# Patient Record
Sex: Male | Born: 1950 | Race: White | Hispanic: No | Smoking: Former smoker
Health system: Southern US, Community
[De-identification: ages and names within clinical notes are randomized; demographics above are authoritative.]

## PROBLEM LIST (undated history)

## (undated) DIAGNOSIS — E785 Hyperlipidemia, unspecified: Secondary | ICD-10-CM

## (undated) DIAGNOSIS — E119 Type 2 diabetes mellitus without complications: Secondary | ICD-10-CM

## (undated) DIAGNOSIS — M542 Cervicalgia: Secondary | ICD-10-CM

## (undated) DIAGNOSIS — J45909 Unspecified asthma, uncomplicated: Secondary | ICD-10-CM

## (undated) DIAGNOSIS — I10 Essential (primary) hypertension: Secondary | ICD-10-CM

## (undated) HISTORY — DX: Cervicalgia: M54.2

## (undated) HISTORY — DX: Type 2 diabetes mellitus without complications: E11.9

## (undated) HISTORY — DX: Unspecified asthma, uncomplicated: J45.909

## (undated) HISTORY — DX: Essential (primary) hypertension: I10

## (undated) HISTORY — DX: Hyperlipidemia, unspecified: E78.5

---

## 2008-06-14 ENCOUNTER — Ambulatory Visit (HOSPITAL_COMMUNITY): Admission: RE | Admit: 2008-06-14 | Discharge: 2008-06-14 | Payer: Self-pay | Admitting: Family Medicine

## 2019-02-24 DIAGNOSIS — E1169 Type 2 diabetes mellitus with other specified complication: Secondary | ICD-10-CM | POA: Diagnosis not present

## 2019-02-24 DIAGNOSIS — J4520 Mild intermittent asthma, uncomplicated: Secondary | ICD-10-CM | POA: Diagnosis not present

## 2019-02-24 DIAGNOSIS — E782 Mixed hyperlipidemia: Secondary | ICD-10-CM | POA: Diagnosis not present

## 2019-02-24 DIAGNOSIS — I10 Essential (primary) hypertension: Secondary | ICD-10-CM | POA: Diagnosis not present

## 2019-03-17 DIAGNOSIS — Z0000 Encounter for general adult medical examination without abnormal findings: Secondary | ICD-10-CM | POA: Diagnosis not present

## 2019-04-22 DIAGNOSIS — I10 Essential (primary) hypertension: Secondary | ICD-10-CM | POA: Diagnosis not present

## 2019-04-22 DIAGNOSIS — R944 Abnormal results of kidney function studies: Secondary | ICD-10-CM | POA: Diagnosis not present

## 2019-04-22 DIAGNOSIS — E1165 Type 2 diabetes mellitus with hyperglycemia: Secondary | ICD-10-CM | POA: Diagnosis not present

## 2019-04-22 DIAGNOSIS — E782 Mixed hyperlipidemia: Secondary | ICD-10-CM | POA: Diagnosis not present

## 2019-04-22 DIAGNOSIS — E119 Type 2 diabetes mellitus without complications: Secondary | ICD-10-CM | POA: Diagnosis not present

## 2019-04-22 DIAGNOSIS — E785 Hyperlipidemia, unspecified: Secondary | ICD-10-CM | POA: Diagnosis not present

## 2019-04-29 DIAGNOSIS — J45909 Unspecified asthma, uncomplicated: Secondary | ICD-10-CM | POA: Diagnosis not present

## 2019-04-29 DIAGNOSIS — Z6832 Body mass index (BMI) 32.0-32.9, adult: Secondary | ICD-10-CM | POA: Diagnosis not present

## 2019-04-29 DIAGNOSIS — E1165 Type 2 diabetes mellitus with hyperglycemia: Secondary | ICD-10-CM | POA: Diagnosis not present

## 2019-04-29 DIAGNOSIS — E785 Hyperlipidemia, unspecified: Secondary | ICD-10-CM | POA: Diagnosis not present

## 2019-04-29 DIAGNOSIS — Z23 Encounter for immunization: Secondary | ICD-10-CM | POA: Diagnosis not present

## 2019-04-29 DIAGNOSIS — I10 Essential (primary) hypertension: Secondary | ICD-10-CM | POA: Diagnosis not present

## 2019-04-29 DIAGNOSIS — Z0001 Encounter for general adult medical examination with abnormal findings: Secondary | ICD-10-CM | POA: Diagnosis not present

## 2019-04-29 DIAGNOSIS — M5442 Lumbago with sciatica, left side: Secondary | ICD-10-CM | POA: Diagnosis not present

## 2019-06-04 ENCOUNTER — Other Ambulatory Visit: Payer: Self-pay

## 2019-06-26 DIAGNOSIS — Z1211 Encounter for screening for malignant neoplasm of colon: Secondary | ICD-10-CM | POA: Diagnosis not present

## 2019-09-03 ENCOUNTER — Encounter: Payer: Self-pay | Admitting: Gastroenterology

## 2019-09-03 ENCOUNTER — Ambulatory Visit (INDEPENDENT_AMBULATORY_CARE_PROVIDER_SITE_OTHER): Payer: PPO | Admitting: Gastroenterology

## 2019-09-03 ENCOUNTER — Other Ambulatory Visit: Payer: Self-pay

## 2019-09-03 VITALS — BP 158/90 | HR 76 | Temp 98.6°F | Ht 75.0 in | Wt 267.0 lb

## 2019-09-03 DIAGNOSIS — R195 Other fecal abnormalities: Secondary | ICD-10-CM | POA: Diagnosis not present

## 2019-09-03 DIAGNOSIS — Z1159 Encounter for screening for other viral diseases: Secondary | ICD-10-CM | POA: Insufficient documentation

## 2019-09-03 MED ORDER — NA SULFATE-K SULFATE-MG SULF 17.5-3.13-1.6 GM/177ML PO SOLN: 1.0000 | Freq: Once | ORAL | 0 refills | Status: AC

## 2019-09-03 NOTE — Progress Notes (Signed)
Assessment and plan reviewed 

## 2019-09-03 NOTE — Patient Instructions (Signed)

## 2019-09-03 NOTE — Progress Notes (Signed)
     09/03/2019 Thomas Hayes [id number] 1951-02-11   HISTORY OF PRESENT ILLNESS: This is a pleasant 68 year old male who is new to our office.  He has been referred here by his PCP, Dr. Nevada Crane, in order to discuss colonoscopy.  He has never had a colonoscopy in the past.  His PCP convinced him to perform an IFOB stool study.  The results were positive so he is here now to reconsider colonoscopy.  He says that he sees occasional bright red blood in his stool that he is always contributed to hemorrhoids.  Otherwise moves his bowels well with no complaints of abdominal pain, etc.  No family history of colon cancer to his knowledge.  Has never been put to sleep with anesthesia in the past so is nervious.   Past Medical History:  Diagnosis Date  . Asthma   . Cervicalgia   . Diabetes mellitus ([place])   . Hyperlipidemia   . Hypertension    History reviewed. No pertinent surgical history.  reports that he quit smoking about 10 years ago. His smoking use included cigarettes. He has never used smokeless tobacco. He reports that he does not drink alcohol or use drugs. family history includes Bone cancer in his mother; Crohn's disease in his sister; Hypertension in his mother; Stroke in his mother. No Known Allergies    Outpatient Encounter Medications as of 09/03/2019  Medication Sig  . albuterol (VENTOLIN HFA) 108 (90 Base) MCG/ACT inhaler Inhale into the lungs every 6 (six) hours as needed for wheezing or shortness of breath.  . hydrochlorothiazide (HYDRODIURIL) 25 MG tablet Take 25 mg by mouth daily.  Marland Kitchen olmesartan (BENICAR) 40 MG tablet Take 40 mg by mouth daily.  . pravastatin (PRAVACHOL) 10 MG tablet Take 10 mg by mouth daily.  . SitaGLIPtin-MetFORMIN HCl (JANUMET XR) [id number] MG TB24 Take 1 tablet by mouth daily.  . Na Sulfate-K Sulfate-Mg Sulf 17.5-3.13-1.6 GM/177ML SOLN Take 1 kit by mouth once for 1 dose.  . [DISCONTINUED] sildenafil (VIAGRA) 25 MG tablet Take 25 mg by mouth daily as needed  for erectile dysfunction.   No facility-administered encounter medications on file as of 09/03/2019.      REVIEW OF SYSTEMS  : All other systems reviewed and negative except where noted in the History of Present Illness.   PHYSICAL EXAM: BP (!) 158/90   Pulse 76   Temp 98.6 F (37 C)   Ht '6\' 3"'$  (1.905 m)   Wt 267 lb (121.1 kg)   BMI 33.37 kg/m  General: Well developed white male in no acute distress Head: Normocephalic and atraumatic Eyes:  Sclerae anicteric, conjunctiva pink. Ears: Normal auditory acuity Lungs: Clear throughout to auscultation; no increased WOB. Heart: Regular rate and rhythm; no M/R/G. Abdomen: Soft, non-distended.  BS present.  Non-tender. Rectal: Will be done at the time of colonoscopy. Musculoskeletal: Symmetrical with no gross deformities  Skin: No lesions on visible extremities Extremities: No edema  Neurological: Alert oriented x 4, grossly non-focal Psychological:  Alert and cooperative. Normal mood and affect  ASSESSMENT AND PLAN: *Positive IFOB:  Never had colonoscopy in the past.  Sees occasional bright red blood in stool that he contributes to hemorrhoids.  Will plan for colonoscopy with Dr. Henrene Pastor.  **The risks, benefits, and alternatives to colonoscopy were discussed with the patient and he consents to proceed.  CC:  Celene Squibb, MD

## 2019-09-23 ENCOUNTER — Encounter: Payer: Self-pay | Admitting: Internal Medicine

## 2019-10-04 ENCOUNTER — Telehealth: Payer: Self-pay | Admitting: Internal Medicine

## 2019-10-04 NOTE — Telephone Encounter (Signed)
The pt is calling due to having a slight cough at times.  No fever. He will be tested for COVID tomorrow prior to the procedure on Thursday.  He will keep the appt for the COVID test and we will see what the result says.. The pt has been advised of the information and verbalized understanding.

## 2019-10-05 ENCOUNTER — Other Ambulatory Visit: Payer: Self-pay | Admitting: Internal Medicine

## 2019-10-05 ENCOUNTER — Ambulatory Visit (INDEPENDENT_AMBULATORY_CARE_PROVIDER_SITE_OTHER): Payer: PPO

## 2019-10-05 DIAGNOSIS — Z1159 Encounter for screening for other viral diseases: Secondary | ICD-10-CM

## 2019-10-06 ENCOUNTER — Telehealth: Payer: Self-pay | Admitting: Internal Medicine

## 2019-10-06 LAB — SARS CORONAVIRUS 2 (TAT 6-24 HRS): SARS Coronavirus 2: POSITIVE — AB

## 2019-10-06 NOTE — Telephone Encounter (Signed)
Can schedule anytime 14 days after the positive Covid test, assuming he is feeling well.  Thanks

## 2019-10-06 NOTE — Telephone Encounter (Signed)
Procedure cancelled. Pt aware. Pt instructed to contact his PCP for further covid instruction. Please advise regarding rescheduling procedure.

## 2019-10-06 NOTE — Telephone Encounter (Signed)
[facility] just called to inform that pt tested positive for Covid-19. They will be sending results shortly. Pt is schedule for a colon tomorrow.

## 2019-10-06 NOTE — Telephone Encounter (Signed)
Colonoscopy rescheduled for 10/22/19@3 :30pm. Pt will not need another covid screen as he would still test positive.

## 2019-10-07 ENCOUNTER — Encounter: Payer: PPO | Admitting: Internal Medicine

## 2019-10-08 NOTE — Telephone Encounter (Signed)
Left message for pt to call back  °

## 2019-10-11 NOTE — Telephone Encounter (Signed)
Spoke with pt and he is still having covid symptoms. Reports he still has no sense of taste or smell. Pt instructed to call the office back once his symptoms are gone and we can get him rescheduled. Pt verbalized understanding.

## 2019-10-22 ENCOUNTER — Encounter: Payer: PPO | Admitting: Internal Medicine

## 2020-02-11 DIAGNOSIS — M25559 Pain in unspecified hip: Secondary | ICD-10-CM | POA: Diagnosis not present

## 2020-02-11 DIAGNOSIS — J45909 Unspecified asthma, uncomplicated: Secondary | ICD-10-CM | POA: Diagnosis not present

## 2020-02-11 DIAGNOSIS — J4520 Mild intermittent asthma, uncomplicated: Secondary | ICD-10-CM | POA: Diagnosis not present

## 2020-02-11 DIAGNOSIS — E1169 Type 2 diabetes mellitus with other specified complication: Secondary | ICD-10-CM | POA: Diagnosis not present

## 2020-02-11 DIAGNOSIS — E119 Type 2 diabetes mellitus without complications: Secondary | ICD-10-CM | POA: Diagnosis not present

## 2020-02-11 DIAGNOSIS — I10 Essential (primary) hypertension: Secondary | ICD-10-CM | POA: Diagnosis not present

## 2020-02-11 DIAGNOSIS — E785 Hyperlipidemia, unspecified: Secondary | ICD-10-CM | POA: Diagnosis not present

## 2020-02-11 DIAGNOSIS — M542 Cervicalgia: Secondary | ICD-10-CM | POA: Diagnosis not present

## 2020-02-11 DIAGNOSIS — E782 Mixed hyperlipidemia: Secondary | ICD-10-CM | POA: Diagnosis not present

## 2020-02-11 DIAGNOSIS — E1165 Type 2 diabetes mellitus with hyperglycemia: Secondary | ICD-10-CM | POA: Diagnosis not present

## 2020-02-11 DIAGNOSIS — R944 Abnormal results of kidney function studies: Secondary | ICD-10-CM | POA: Diagnosis not present

## 2020-02-11 DIAGNOSIS — M5442 Lumbago with sciatica, left side: Secondary | ICD-10-CM | POA: Diagnosis not present

## 2020-02-14 DIAGNOSIS — E1169 Type 2 diabetes mellitus with other specified complication: Secondary | ICD-10-CM | POA: Diagnosis not present

## 2020-02-14 DIAGNOSIS — I10 Essential (primary) hypertension: Secondary | ICD-10-CM | POA: Diagnosis not present

## 2020-02-14 DIAGNOSIS — Z6832 Body mass index (BMI) 32.0-32.9, adult: Secondary | ICD-10-CM | POA: Diagnosis not present

## 2020-02-14 DIAGNOSIS — M25552 Pain in left hip: Secondary | ICD-10-CM | POA: Diagnosis not present

## 2020-02-14 DIAGNOSIS — M5442 Lumbago with sciatica, left side: Secondary | ICD-10-CM | POA: Diagnosis not present

## 2020-02-14 DIAGNOSIS — E782 Mixed hyperlipidemia: Secondary | ICD-10-CM | POA: Diagnosis not present

## 2020-02-14 DIAGNOSIS — E6609 Other obesity due to excess calories: Secondary | ICD-10-CM | POA: Diagnosis not present

## 2020-02-14 DIAGNOSIS — J45909 Unspecified asthma, uncomplicated: Secondary | ICD-10-CM | POA: Diagnosis not present

## 2020-02-14 DIAGNOSIS — Z8616 Personal history of COVID-19: Secondary | ICD-10-CM | POA: Diagnosis not present

## 2020-05-19 DIAGNOSIS — E1169 Type 2 diabetes mellitus with other specified complication: Secondary | ICD-10-CM | POA: Diagnosis not present

## 2020-05-19 DIAGNOSIS — E1165 Type 2 diabetes mellitus with hyperglycemia: Secondary | ICD-10-CM | POA: Diagnosis not present

## 2020-05-19 DIAGNOSIS — E119 Type 2 diabetes mellitus without complications: Secondary | ICD-10-CM | POA: Diagnosis not present

## 2020-05-19 DIAGNOSIS — Z0000 Encounter for general adult medical examination without abnormal findings: Secondary | ICD-10-CM | POA: Diagnosis not present

## 2020-05-19 DIAGNOSIS — Z8616 Personal history of COVID-19: Secondary | ICD-10-CM | POA: Diagnosis not present

## 2020-05-19 DIAGNOSIS — J4520 Mild intermittent asthma, uncomplicated: Secondary | ICD-10-CM | POA: Diagnosis not present

## 2020-05-19 DIAGNOSIS — M542 Cervicalgia: Secondary | ICD-10-CM | POA: Diagnosis not present

## 2020-05-19 DIAGNOSIS — I10 Essential (primary) hypertension: Secondary | ICD-10-CM | POA: Diagnosis not present

## 2020-05-19 DIAGNOSIS — E782 Mixed hyperlipidemia: Secondary | ICD-10-CM | POA: Diagnosis not present

## 2020-05-19 DIAGNOSIS — M25559 Pain in unspecified hip: Secondary | ICD-10-CM | POA: Diagnosis not present

## 2020-05-19 DIAGNOSIS — Z23 Encounter for immunization: Secondary | ICD-10-CM | POA: Diagnosis not present

## 2020-05-19 DIAGNOSIS — R944 Abnormal results of kidney function studies: Secondary | ICD-10-CM | POA: Diagnosis not present

## 2020-05-22 DIAGNOSIS — Z8616 Personal history of COVID-19: Secondary | ICD-10-CM | POA: Diagnosis not present

## 2020-05-22 DIAGNOSIS — E6609 Other obesity due to excess calories: Secondary | ICD-10-CM | POA: Diagnosis not present

## 2020-05-22 DIAGNOSIS — E1169 Type 2 diabetes mellitus with other specified complication: Secondary | ICD-10-CM | POA: Diagnosis not present

## 2020-05-22 DIAGNOSIS — I10 Essential (primary) hypertension: Secondary | ICD-10-CM | POA: Diagnosis not present

## 2020-05-22 DIAGNOSIS — Z0001 Encounter for general adult medical examination with abnormal findings: Secondary | ICD-10-CM | POA: Diagnosis not present

## 2020-05-22 DIAGNOSIS — Z6832 Body mass index (BMI) 32.0-32.9, adult: Secondary | ICD-10-CM | POA: Diagnosis not present

## 2020-05-22 DIAGNOSIS — M25552 Pain in left hip: Secondary | ICD-10-CM | POA: Diagnosis not present

## 2020-05-22 DIAGNOSIS — E782 Mixed hyperlipidemia: Secondary | ICD-10-CM | POA: Diagnosis not present

## 2020-05-22 DIAGNOSIS — J45909 Unspecified asthma, uncomplicated: Secondary | ICD-10-CM | POA: Diagnosis not present

## 2020-05-22 DIAGNOSIS — M5442 Lumbago with sciatica, left side: Secondary | ICD-10-CM | POA: Diagnosis not present

## 2020-05-30 ENCOUNTER — Other Ambulatory Visit: Payer: Self-pay

## 2020-10-04 DIAGNOSIS — M542 Cervicalgia: Secondary | ICD-10-CM | POA: Diagnosis not present

## 2020-10-04 DIAGNOSIS — E1169 Type 2 diabetes mellitus with other specified complication: Secondary | ICD-10-CM | POA: Diagnosis not present

## 2020-10-04 DIAGNOSIS — E782 Mixed hyperlipidemia: Secondary | ICD-10-CM | POA: Diagnosis not present

## 2020-10-04 DIAGNOSIS — E119 Type 2 diabetes mellitus without complications: Secondary | ICD-10-CM | POA: Diagnosis not present

## 2020-10-04 DIAGNOSIS — J4520 Mild intermittent asthma, uncomplicated: Secondary | ICD-10-CM | POA: Diagnosis not present

## 2020-10-04 DIAGNOSIS — R944 Abnormal results of kidney function studies: Secondary | ICD-10-CM | POA: Diagnosis not present

## 2020-10-04 DIAGNOSIS — Z0000 Encounter for general adult medical examination without abnormal findings: Secondary | ICD-10-CM | POA: Diagnosis not present

## 2020-10-04 DIAGNOSIS — E785 Hyperlipidemia, unspecified: Secondary | ICD-10-CM | POA: Diagnosis not present

## 2020-10-04 DIAGNOSIS — Z23 Encounter for immunization: Secondary | ICD-10-CM | POA: Diagnosis not present

## 2020-10-04 DIAGNOSIS — E1165 Type 2 diabetes mellitus with hyperglycemia: Secondary | ICD-10-CM | POA: Diagnosis not present

## 2020-10-04 DIAGNOSIS — M25559 Pain in unspecified hip: Secondary | ICD-10-CM | POA: Diagnosis not present

## 2020-10-04 DIAGNOSIS — I10 Essential (primary) hypertension: Secondary | ICD-10-CM | POA: Diagnosis not present

## 2020-10-11 DIAGNOSIS — J45909 Unspecified asthma, uncomplicated: Secondary | ICD-10-CM | POA: Diagnosis not present

## 2020-10-11 DIAGNOSIS — E1169 Type 2 diabetes mellitus with other specified complication: Secondary | ICD-10-CM | POA: Diagnosis not present

## 2020-10-11 DIAGNOSIS — M5442 Lumbago with sciatica, left side: Secondary | ICD-10-CM | POA: Diagnosis not present

## 2020-10-11 DIAGNOSIS — E782 Mixed hyperlipidemia: Secondary | ICD-10-CM | POA: Diagnosis not present

## 2020-10-11 DIAGNOSIS — Z6832 Body mass index (BMI) 32.0-32.9, adult: Secondary | ICD-10-CM | POA: Diagnosis not present

## 2020-10-11 DIAGNOSIS — M25552 Pain in left hip: Secondary | ICD-10-CM | POA: Diagnosis not present

## 2020-10-11 DIAGNOSIS — I10 Essential (primary) hypertension: Secondary | ICD-10-CM | POA: Diagnosis not present

## 2020-10-11 DIAGNOSIS — Z8616 Personal history of COVID-19: Secondary | ICD-10-CM | POA: Diagnosis not present

## 2020-10-11 DIAGNOSIS — E6609 Other obesity due to excess calories: Secondary | ICD-10-CM | POA: Diagnosis not present

## 2021-01-12 DIAGNOSIS — R944 Abnormal results of kidney function studies: Secondary | ICD-10-CM | POA: Diagnosis not present

## 2021-01-12 DIAGNOSIS — E6609 Other obesity due to excess calories: Secondary | ICD-10-CM | POA: Diagnosis not present

## 2021-01-12 DIAGNOSIS — I10 Essential (primary) hypertension: Secondary | ICD-10-CM | POA: Diagnosis not present

## 2021-01-12 DIAGNOSIS — E1165 Type 2 diabetes mellitus with hyperglycemia: Secondary | ICD-10-CM | POA: Diagnosis not present

## 2021-01-12 DIAGNOSIS — Z6832 Body mass index (BMI) 32.0-32.9, adult: Secondary | ICD-10-CM | POA: Diagnosis not present

## 2021-01-12 DIAGNOSIS — E782 Mixed hyperlipidemia: Secondary | ICD-10-CM | POA: Diagnosis not present

## 2021-01-17 DIAGNOSIS — M25552 Pain in left hip: Secondary | ICD-10-CM | POA: Diagnosis not present

## 2021-01-17 DIAGNOSIS — J45909 Unspecified asthma, uncomplicated: Secondary | ICD-10-CM | POA: Diagnosis not present

## 2021-01-17 DIAGNOSIS — E6609 Other obesity due to excess calories: Secondary | ICD-10-CM | POA: Diagnosis not present

## 2021-01-17 DIAGNOSIS — M5442 Lumbago with sciatica, left side: Secondary | ICD-10-CM | POA: Diagnosis not present

## 2021-01-17 DIAGNOSIS — E782 Mixed hyperlipidemia: Secondary | ICD-10-CM | POA: Diagnosis not present

## 2021-01-17 DIAGNOSIS — I10 Essential (primary) hypertension: Secondary | ICD-10-CM | POA: Diagnosis not present

## 2021-01-17 DIAGNOSIS — Z8616 Personal history of COVID-19: Secondary | ICD-10-CM | POA: Diagnosis not present

## 2021-01-17 DIAGNOSIS — E1169 Type 2 diabetes mellitus with other specified complication: Secondary | ICD-10-CM | POA: Diagnosis not present

## 2021-01-17 DIAGNOSIS — Z6832 Body mass index (BMI) 32.0-32.9, adult: Secondary | ICD-10-CM | POA: Diagnosis not present

## 2021-04-11 DIAGNOSIS — Z0100 Encounter for examination of eyes and vision without abnormal findings: Secondary | ICD-10-CM | POA: Diagnosis not present

## 2021-04-11 DIAGNOSIS — H5200 Hypermetropia, unspecified eye: Secondary | ICD-10-CM | POA: Diagnosis not present

## 2021-05-18 DIAGNOSIS — E785 Hyperlipidemia, unspecified: Secondary | ICD-10-CM | POA: Diagnosis not present

## 2021-05-18 DIAGNOSIS — E1165 Type 2 diabetes mellitus with hyperglycemia: Secondary | ICD-10-CM | POA: Diagnosis not present

## 2021-05-22 DIAGNOSIS — K921 Melena: Secondary | ICD-10-CM | POA: Diagnosis not present

## 2021-05-22 DIAGNOSIS — K644 Residual hemorrhoidal skin tags: Secondary | ICD-10-CM | POA: Diagnosis not present

## 2021-05-22 DIAGNOSIS — E782 Mixed hyperlipidemia: Secondary | ICD-10-CM | POA: Diagnosis not present

## 2021-05-22 DIAGNOSIS — E669 Obesity, unspecified: Secondary | ICD-10-CM | POA: Diagnosis not present

## 2021-05-22 DIAGNOSIS — J4520 Mild intermittent asthma, uncomplicated: Secondary | ICD-10-CM | POA: Diagnosis not present

## 2021-05-22 DIAGNOSIS — I10 Essential (primary) hypertension: Secondary | ICD-10-CM | POA: Diagnosis not present

## 2021-05-22 DIAGNOSIS — E1169 Type 2 diabetes mellitus with other specified complication: Secondary | ICD-10-CM | POA: Diagnosis not present

## 2021-10-18 DIAGNOSIS — K644 Residual hemorrhoidal skin tags: Secondary | ICD-10-CM | POA: Diagnosis not present

## 2021-10-18 DIAGNOSIS — E1169 Type 2 diabetes mellitus with other specified complication: Secondary | ICD-10-CM | POA: Diagnosis not present

## 2021-10-18 DIAGNOSIS — E782 Mixed hyperlipidemia: Secondary | ICD-10-CM | POA: Diagnosis not present

## 2021-10-23 DIAGNOSIS — K644 Residual hemorrhoidal skin tags: Secondary | ICD-10-CM | POA: Diagnosis not present

## 2021-10-23 DIAGNOSIS — B3749 Other urogenital candidiasis: Secondary | ICD-10-CM | POA: Diagnosis not present

## 2021-10-23 DIAGNOSIS — E669 Obesity, unspecified: Secondary | ICD-10-CM | POA: Diagnosis not present

## 2021-10-23 DIAGNOSIS — E782 Mixed hyperlipidemia: Secondary | ICD-10-CM | POA: Diagnosis not present

## 2021-10-23 DIAGNOSIS — I10 Essential (primary) hypertension: Secondary | ICD-10-CM | POA: Diagnosis not present

## 2021-10-23 DIAGNOSIS — Z0001 Encounter for general adult medical examination with abnormal findings: Secondary | ICD-10-CM | POA: Diagnosis not present

## 2021-10-23 DIAGNOSIS — K921 Melena: Secondary | ICD-10-CM | POA: Diagnosis not present

## 2021-10-23 DIAGNOSIS — E1169 Type 2 diabetes mellitus with other specified complication: Secondary | ICD-10-CM | POA: Diagnosis not present

## 2021-10-23 DIAGNOSIS — J4520 Mild intermittent asthma, uncomplicated: Secondary | ICD-10-CM | POA: Diagnosis not present

## 2022-04-25 DIAGNOSIS — H524 Presbyopia: Secondary | ICD-10-CM | POA: Diagnosis not present

## 2022-05-22 DIAGNOSIS — E1169 Type 2 diabetes mellitus with other specified complication: Secondary | ICD-10-CM | POA: Diagnosis not present

## 2022-05-22 DIAGNOSIS — E782 Mixed hyperlipidemia: Secondary | ICD-10-CM | POA: Diagnosis not present

## 2022-05-28 DIAGNOSIS — J4520 Mild intermittent asthma, uncomplicated: Secondary | ICD-10-CM | POA: Diagnosis not present

## 2022-05-28 DIAGNOSIS — E669 Obesity, unspecified: Secondary | ICD-10-CM | POA: Diagnosis not present

## 2022-05-28 DIAGNOSIS — Z6833 Body mass index (BMI) 33.0-33.9, adult: Secondary | ICD-10-CM | POA: Diagnosis not present

## 2022-05-28 DIAGNOSIS — E1169 Type 2 diabetes mellitus with other specified complication: Secondary | ICD-10-CM | POA: Diagnosis not present

## 2022-05-28 DIAGNOSIS — Z125 Encounter for screening for malignant neoplasm of prostate: Secondary | ICD-10-CM | POA: Diagnosis not present

## 2022-05-28 DIAGNOSIS — I10 Essential (primary) hypertension: Secondary | ICD-10-CM | POA: Diagnosis not present

## 2022-05-28 DIAGNOSIS — K644 Residual hemorrhoidal skin tags: Secondary | ICD-10-CM | POA: Diagnosis not present

## 2022-05-28 DIAGNOSIS — K921 Melena: Secondary | ICD-10-CM | POA: Diagnosis not present

## 2022-05-28 DIAGNOSIS — E782 Mixed hyperlipidemia: Secondary | ICD-10-CM | POA: Diagnosis not present

## 2022-10-18 DIAGNOSIS — E1169 Type 2 diabetes mellitus with other specified complication: Secondary | ICD-10-CM | POA: Diagnosis not present

## 2022-10-18 DIAGNOSIS — E782 Mixed hyperlipidemia: Secondary | ICD-10-CM | POA: Diagnosis not present

## 2022-10-18 DIAGNOSIS — Z125 Encounter for screening for malignant neoplasm of prostate: Secondary | ICD-10-CM | POA: Diagnosis not present

## 2022-10-23 DIAGNOSIS — J4520 Mild intermittent asthma, uncomplicated: Secondary | ICD-10-CM | POA: Diagnosis not present

## 2022-10-23 DIAGNOSIS — R000 Tachycardia, unspecified: Secondary | ICD-10-CM | POA: Diagnosis not present

## 2022-10-23 DIAGNOSIS — I10 Essential (primary) hypertension: Secondary | ICD-10-CM | POA: Diagnosis not present

## 2022-10-23 DIAGNOSIS — E1169 Type 2 diabetes mellitus with other specified complication: Secondary | ICD-10-CM | POA: Diagnosis not present

## 2022-10-23 DIAGNOSIS — K921 Melena: Secondary | ICD-10-CM | POA: Diagnosis not present

## 2022-10-23 DIAGNOSIS — Z0001 Encounter for general adult medical examination with abnormal findings: Secondary | ICD-10-CM | POA: Diagnosis not present

## 2022-10-23 DIAGNOSIS — E118 Type 2 diabetes mellitus with unspecified complications: Secondary | ICD-10-CM | POA: Diagnosis not present

## 2022-10-23 DIAGNOSIS — K644 Residual hemorrhoidal skin tags: Secondary | ICD-10-CM | POA: Diagnosis not present

## 2022-10-23 DIAGNOSIS — E782 Mixed hyperlipidemia: Secondary | ICD-10-CM | POA: Diagnosis not present

## 2022-10-23 DIAGNOSIS — Z6834 Body mass index (BMI) 34.0-34.9, adult: Secondary | ICD-10-CM | POA: Diagnosis not present

## 2022-10-23 DIAGNOSIS — E669 Obesity, unspecified: Secondary | ICD-10-CM | POA: Diagnosis not present

## 2022-11-13 DIAGNOSIS — R000 Tachycardia, unspecified: Secondary | ICD-10-CM | POA: Diagnosis not present

## 2022-11-13 DIAGNOSIS — Z6834 Body mass index (BMI) 34.0-34.9, adult: Secondary | ICD-10-CM | POA: Diagnosis not present

## 2022-11-13 DIAGNOSIS — I10 Essential (primary) hypertension: Secondary | ICD-10-CM | POA: Diagnosis not present

## 2022-11-13 DIAGNOSIS — E669 Obesity, unspecified: Secondary | ICD-10-CM | POA: Diagnosis not present

## 2022-11-13 DIAGNOSIS — E118 Type 2 diabetes mellitus with unspecified complications: Secondary | ICD-10-CM | POA: Diagnosis not present

## 2022-11-13 DIAGNOSIS — Z79899 Other long term (current) drug therapy: Secondary | ICD-10-CM | POA: Diagnosis not present

## 2022-11-13 DIAGNOSIS — Z7182 Exercise counseling: Secondary | ICD-10-CM | POA: Diagnosis not present

## 2022-11-13 DIAGNOSIS — Z7984 Long term (current) use of oral hypoglycemic drugs: Secondary | ICD-10-CM | POA: Diagnosis not present

## 2022-11-13 DIAGNOSIS — E1169 Type 2 diabetes mellitus with other specified complication: Secondary | ICD-10-CM | POA: Diagnosis not present

## 2023-02-20 DIAGNOSIS — E1169 Type 2 diabetes mellitus with other specified complication: Secondary | ICD-10-CM | POA: Diagnosis not present

## 2023-02-20 DIAGNOSIS — E782 Mixed hyperlipidemia: Secondary | ICD-10-CM | POA: Diagnosis not present

## 2023-02-26 DIAGNOSIS — E669 Obesity, unspecified: Secondary | ICD-10-CM | POA: Diagnosis not present

## 2023-02-26 DIAGNOSIS — K644 Residual hemorrhoidal skin tags: Secondary | ICD-10-CM | POA: Diagnosis not present

## 2023-02-26 DIAGNOSIS — K921 Melena: Secondary | ICD-10-CM | POA: Diagnosis not present

## 2023-02-26 DIAGNOSIS — E1169 Type 2 diabetes mellitus with other specified complication: Secondary | ICD-10-CM | POA: Diagnosis not present

## 2023-02-26 DIAGNOSIS — E782 Mixed hyperlipidemia: Secondary | ICD-10-CM | POA: Diagnosis not present

## 2023-02-26 DIAGNOSIS — Z79899 Other long term (current) drug therapy: Secondary | ICD-10-CM | POA: Diagnosis not present

## 2023-02-26 DIAGNOSIS — R000 Tachycardia, unspecified: Secondary | ICD-10-CM | POA: Diagnosis not present

## 2023-02-26 DIAGNOSIS — J4520 Mild intermittent asthma, uncomplicated: Secondary | ICD-10-CM | POA: Diagnosis not present

## 2023-02-26 DIAGNOSIS — I10 Essential (primary) hypertension: Secondary | ICD-10-CM | POA: Diagnosis not present

## 2023-02-26 DIAGNOSIS — Z6835 Body mass index (BMI) 35.0-35.9, adult: Secondary | ICD-10-CM | POA: Diagnosis not present

## 2023-04-15 DIAGNOSIS — H52223 Regular astigmatism, bilateral: Secondary | ICD-10-CM | POA: Diagnosis not present

## 2023-06-19 DIAGNOSIS — E782 Mixed hyperlipidemia: Secondary | ICD-10-CM | POA: Diagnosis not present

## 2023-06-19 DIAGNOSIS — E1169 Type 2 diabetes mellitus with other specified complication: Secondary | ICD-10-CM | POA: Diagnosis not present

## 2023-06-25 DIAGNOSIS — J4520 Mild intermittent asthma, uncomplicated: Secondary | ICD-10-CM | POA: Diagnosis not present

## 2023-06-25 DIAGNOSIS — E782 Mixed hyperlipidemia: Secondary | ICD-10-CM | POA: Diagnosis not present

## 2023-06-25 DIAGNOSIS — K644 Residual hemorrhoidal skin tags: Secondary | ICD-10-CM | POA: Diagnosis not present

## 2023-06-25 DIAGNOSIS — I10 Essential (primary) hypertension: Secondary | ICD-10-CM | POA: Diagnosis not present

## 2023-06-25 DIAGNOSIS — Z7182 Exercise counseling: Secondary | ICD-10-CM | POA: Diagnosis not present

## 2023-06-25 DIAGNOSIS — Z79899 Other long term (current) drug therapy: Secondary | ICD-10-CM | POA: Diagnosis not present

## 2023-06-25 DIAGNOSIS — Z6834 Body mass index (BMI) 34.0-34.9, adult: Secondary | ICD-10-CM | POA: Diagnosis not present

## 2023-06-25 DIAGNOSIS — E669 Obesity, unspecified: Secondary | ICD-10-CM | POA: Diagnosis not present

## 2023-06-25 DIAGNOSIS — E1169 Type 2 diabetes mellitus with other specified complication: Secondary | ICD-10-CM | POA: Diagnosis not present

## 2023-06-25 DIAGNOSIS — R000 Tachycardia, unspecified: Secondary | ICD-10-CM | POA: Diagnosis not present

## 2023-06-25 DIAGNOSIS — K921 Melena: Secondary | ICD-10-CM | POA: Diagnosis not present

## 2023-06-25 DIAGNOSIS — Z713 Dietary counseling and surveillance: Secondary | ICD-10-CM | POA: Diagnosis not present

## 2023-07-23 DIAGNOSIS — E669 Obesity, unspecified: Secondary | ICD-10-CM | POA: Diagnosis not present

## 2023-07-23 DIAGNOSIS — E1169 Type 2 diabetes mellitus with other specified complication: Secondary | ICD-10-CM | POA: Diagnosis not present

## 2023-07-23 DIAGNOSIS — I10 Essential (primary) hypertension: Secondary | ICD-10-CM | POA: Diagnosis not present

## 2023-07-23 DIAGNOSIS — Z0000 Encounter for general adult medical examination without abnormal findings: Secondary | ICD-10-CM | POA: Diagnosis not present

## 2023-07-23 DIAGNOSIS — Z23 Encounter for immunization: Secondary | ICD-10-CM | POA: Diagnosis not present

## 2023-07-23 DIAGNOSIS — R000 Tachycardia, unspecified: Secondary | ICD-10-CM | POA: Diagnosis not present

## 2023-10-24 DIAGNOSIS — E782 Mixed hyperlipidemia: Secondary | ICD-10-CM | POA: Diagnosis not present

## 2023-10-24 DIAGNOSIS — E1169 Type 2 diabetes mellitus with other specified complication: Secondary | ICD-10-CM | POA: Diagnosis not present

## 2023-10-24 DIAGNOSIS — Z125 Encounter for screening for malignant neoplasm of prostate: Secondary | ICD-10-CM | POA: Diagnosis not present

## 2023-10-31 DIAGNOSIS — E1169 Type 2 diabetes mellitus with other specified complication: Secondary | ICD-10-CM | POA: Diagnosis not present

## 2023-10-31 DIAGNOSIS — E1159 Type 2 diabetes mellitus with other circulatory complications: Secondary | ICD-10-CM | POA: Diagnosis not present

## 2023-10-31 DIAGNOSIS — E782 Mixed hyperlipidemia: Secondary | ICD-10-CM | POA: Diagnosis not present

## 2023-10-31 DIAGNOSIS — K644 Residual hemorrhoidal skin tags: Secondary | ICD-10-CM | POA: Diagnosis not present

## 2023-10-31 DIAGNOSIS — J4520 Mild intermittent asthma, uncomplicated: Secondary | ICD-10-CM | POA: Diagnosis not present

## 2023-10-31 DIAGNOSIS — E669 Obesity, unspecified: Secondary | ICD-10-CM | POA: Diagnosis not present

## 2023-10-31 DIAGNOSIS — Z713 Dietary counseling and surveillance: Secondary | ICD-10-CM | POA: Diagnosis not present

## 2023-10-31 DIAGNOSIS — Z7182 Exercise counseling: Secondary | ICD-10-CM | POA: Diagnosis not present

## 2023-10-31 DIAGNOSIS — I10 Essential (primary) hypertension: Secondary | ICD-10-CM | POA: Diagnosis not present

## 2023-10-31 DIAGNOSIS — E870 Hyperosmolality and hypernatremia: Secondary | ICD-10-CM | POA: Diagnosis not present

## 2023-10-31 DIAGNOSIS — R000 Tachycardia, unspecified: Secondary | ICD-10-CM | POA: Diagnosis not present

## 2023-10-31 DIAGNOSIS — K921 Melena: Secondary | ICD-10-CM | POA: Diagnosis not present

## 2024-02-24 DIAGNOSIS — E1169 Type 2 diabetes mellitus with other specified complication: Secondary | ICD-10-CM | POA: Diagnosis not present

## 2024-02-24 DIAGNOSIS — E782 Mixed hyperlipidemia: Secondary | ICD-10-CM | POA: Diagnosis not present

## 2024-03-01 DIAGNOSIS — E870 Hyperosmolality and hypernatremia: Secondary | ICD-10-CM | POA: Diagnosis not present

## 2024-03-01 DIAGNOSIS — R809 Proteinuria, unspecified: Secondary | ICD-10-CM | POA: Diagnosis not present

## 2024-03-01 DIAGNOSIS — E1159 Type 2 diabetes mellitus with other circulatory complications: Secondary | ICD-10-CM | POA: Diagnosis not present

## 2024-03-01 DIAGNOSIS — Z7984 Long term (current) use of oral hypoglycemic drugs: Secondary | ICD-10-CM | POA: Diagnosis not present

## 2024-03-01 DIAGNOSIS — E669 Obesity, unspecified: Secondary | ICD-10-CM | POA: Diagnosis not present

## 2024-03-01 DIAGNOSIS — E1169 Type 2 diabetes mellitus with other specified complication: Secondary | ICD-10-CM | POA: Diagnosis not present

## 2024-03-01 DIAGNOSIS — K921 Melena: Secondary | ICD-10-CM | POA: Diagnosis not present

## 2024-03-01 DIAGNOSIS — E782 Mixed hyperlipidemia: Secondary | ICD-10-CM | POA: Diagnosis not present

## 2024-03-01 DIAGNOSIS — R000 Tachycardia, unspecified: Secondary | ICD-10-CM | POA: Diagnosis not present

## 2024-03-01 DIAGNOSIS — I10 Essential (primary) hypertension: Secondary | ICD-10-CM | POA: Diagnosis not present

## 2024-03-01 DIAGNOSIS — K644 Residual hemorrhoidal skin tags: Secondary | ICD-10-CM | POA: Diagnosis not present

## 2024-03-01 DIAGNOSIS — J4520 Mild intermittent asthma, uncomplicated: Secondary | ICD-10-CM | POA: Diagnosis not present

## 2024-06-25 DIAGNOSIS — E1169 Type 2 diabetes mellitus with other specified complication: Secondary | ICD-10-CM | POA: Diagnosis not present

## 2024-06-25 DIAGNOSIS — E782 Mixed hyperlipidemia: Secondary | ICD-10-CM | POA: Diagnosis not present

## 2024-07-01 DIAGNOSIS — K921 Melena: Secondary | ICD-10-CM | POA: Diagnosis not present

## 2024-07-01 DIAGNOSIS — K644 Residual hemorrhoidal skin tags: Secondary | ICD-10-CM | POA: Diagnosis not present

## 2024-07-01 DIAGNOSIS — E782 Mixed hyperlipidemia: Secondary | ICD-10-CM | POA: Diagnosis not present

## 2024-07-01 DIAGNOSIS — R000 Tachycardia, unspecified: Secondary | ICD-10-CM | POA: Diagnosis not present

## 2024-07-01 DIAGNOSIS — E1169 Type 2 diabetes mellitus with other specified complication: Secondary | ICD-10-CM | POA: Diagnosis not present

## 2024-07-01 DIAGNOSIS — L219 Seborrheic dermatitis, unspecified: Secondary | ICD-10-CM | POA: Diagnosis not present

## 2024-07-01 DIAGNOSIS — E1165 Type 2 diabetes mellitus with hyperglycemia: Secondary | ICD-10-CM | POA: Diagnosis not present

## 2024-07-01 DIAGNOSIS — I10 Essential (primary) hypertension: Secondary | ICD-10-CM | POA: Diagnosis not present

## 2024-07-01 DIAGNOSIS — R809 Proteinuria, unspecified: Secondary | ICD-10-CM | POA: Diagnosis not present

## 2024-07-01 DIAGNOSIS — J4520 Mild intermittent asthma, uncomplicated: Secondary | ICD-10-CM | POA: Diagnosis not present

## 2024-07-01 DIAGNOSIS — E870 Hyperosmolality and hypernatremia: Secondary | ICD-10-CM | POA: Diagnosis not present

## 2024-07-01 DIAGNOSIS — E669 Obesity, unspecified: Secondary | ICD-10-CM | POA: Diagnosis not present
